# Patient Record
Sex: Male | Born: 1960 | Race: White | Hispanic: No | Marital: Married | Smoking: Never smoker
Health system: Southern US, Community
[De-identification: ages and names within clinical notes are randomized; demographics above are authoritative.]

## PROBLEM LIST (undated history)

## (undated) DIAGNOSIS — C801 Malignant (primary) neoplasm, unspecified: Secondary | ICD-10-CM

## (undated) DIAGNOSIS — E119 Type 2 diabetes mellitus without complications: Secondary | ICD-10-CM

## (undated) DIAGNOSIS — E7800 Pure hypercholesterolemia, unspecified: Secondary | ICD-10-CM

## (undated) DIAGNOSIS — K219 Gastro-esophageal reflux disease without esophagitis: Secondary | ICD-10-CM

## (undated) HISTORY — PX: TONSILLECTOMY: SUR1361

---

## 2003-11-17 ENCOUNTER — Inpatient Hospital Stay: Payer: Self-pay

## 2006-08-30 IMAGING — CT CT ABD-PELV W/ CM
1 of 3 series · 15 of 32 positions shown, 19 images · non-contrast
Comparison: none

REASON FOR EXAM: (1) abd pain  left lower quadrant  talked to dr. Heard;
(2) abd pain
COMMENTS:

PROCEDURE:     CT  - CT ABDOMEN / PELVIS  W  - November 17, 2003 [DATE]
RESULT:
TECHNIQUE: Axial 8 mm reconstructions were performed from the lung bases
through the pubic symphysis during administration of intravenous and after
administration of oral contrast.  There are no prior studies for comparison.

[Series 3: inspace · axial · 0.70mm/px · z∈[-859,-427]mm · 15 of 488 slices shown, 19 images]
[im 37/488  soft-tissue]
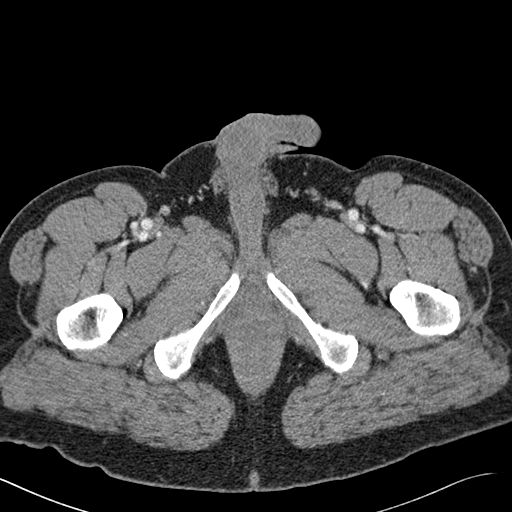
[im 37/488  bone]
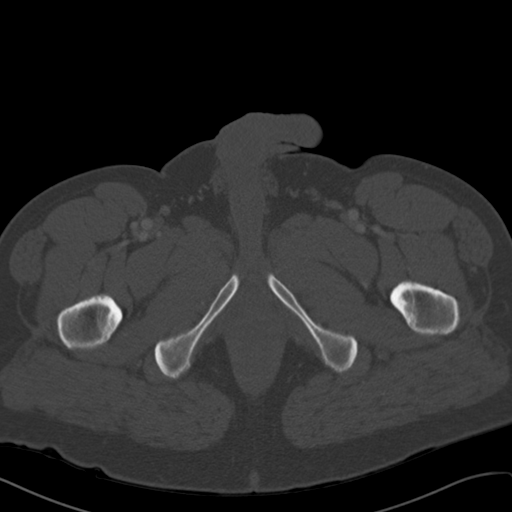
[im 73/488  soft-tissue]
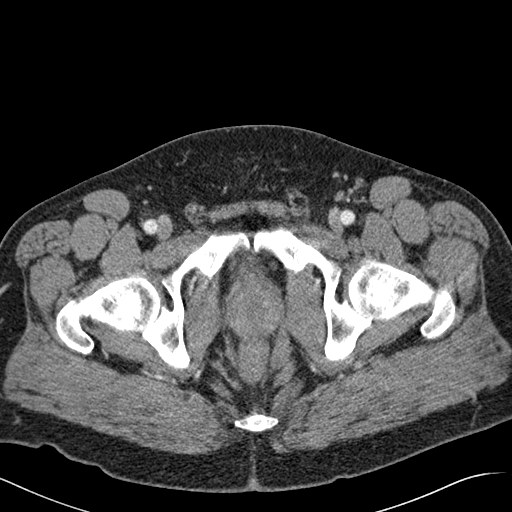
[im 109/488  soft-tissue]
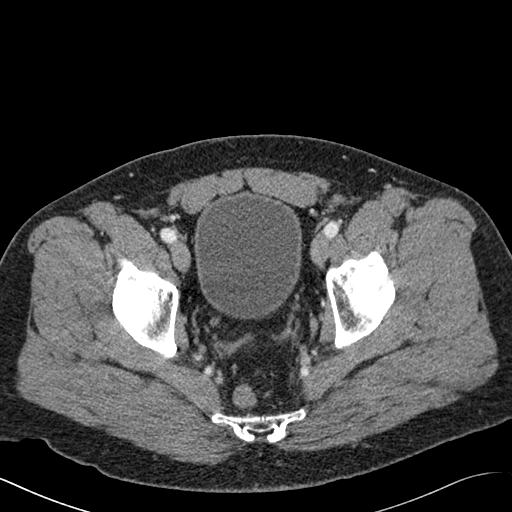
[im 145/488  soft-tissue]
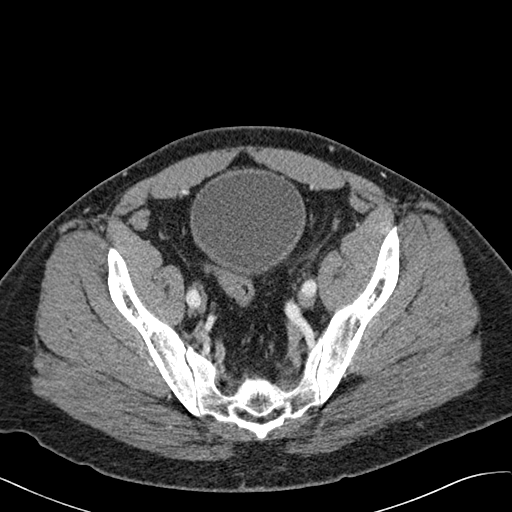
[im 181/488  soft-tissue]
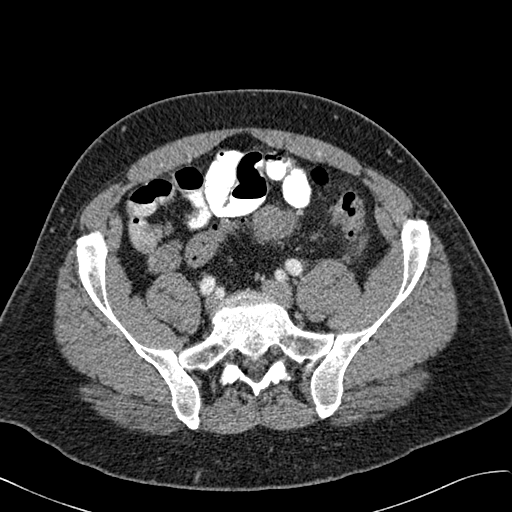
[im 217/488  soft-tissue]
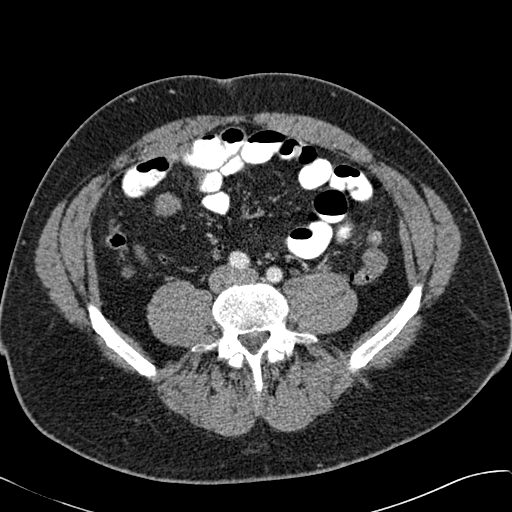
[im 253/488  soft-tissue]
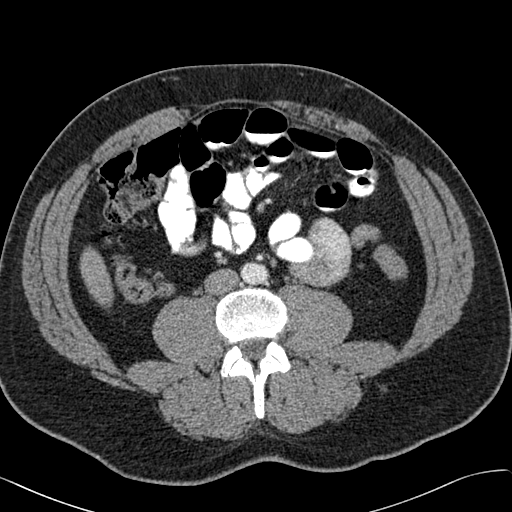
[im 289/488  soft-tissue]
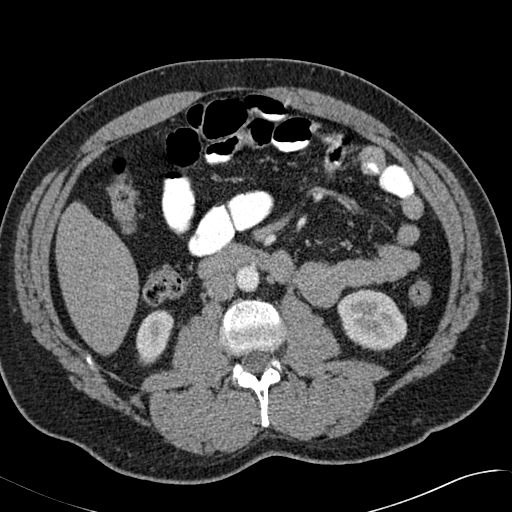
[im 325/488  soft-tissue]
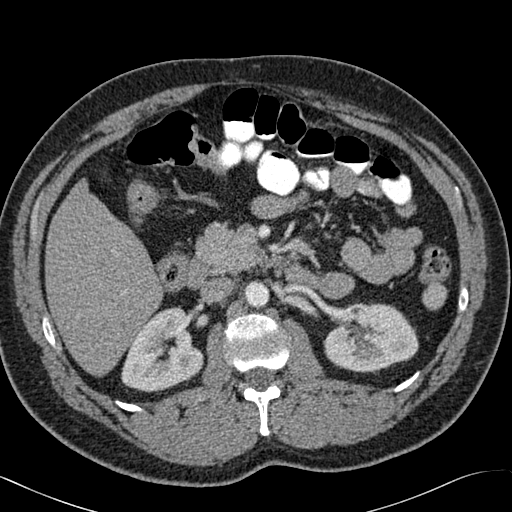
[im 325/488  bone]
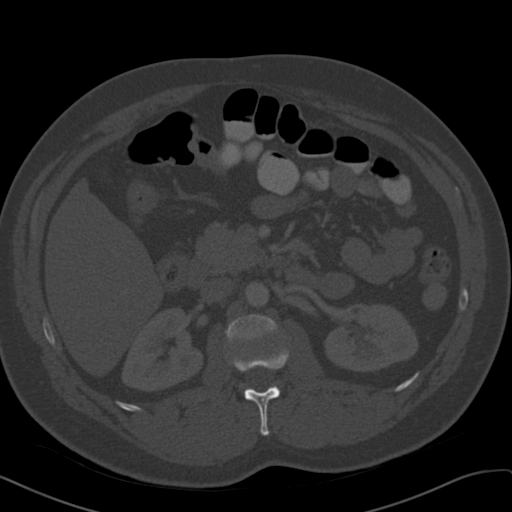
[im 361/488  soft-tissue]
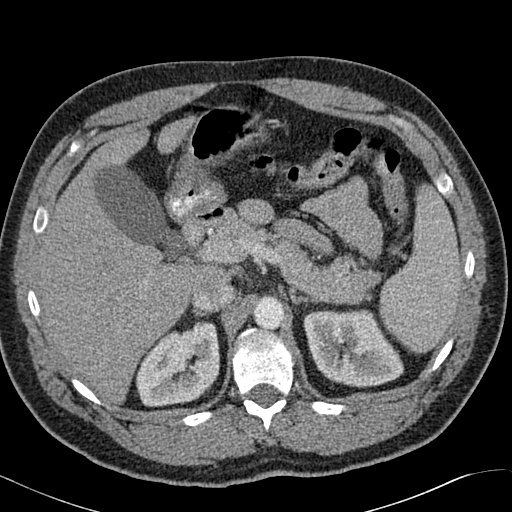
[im 397/488  soft-tissue]
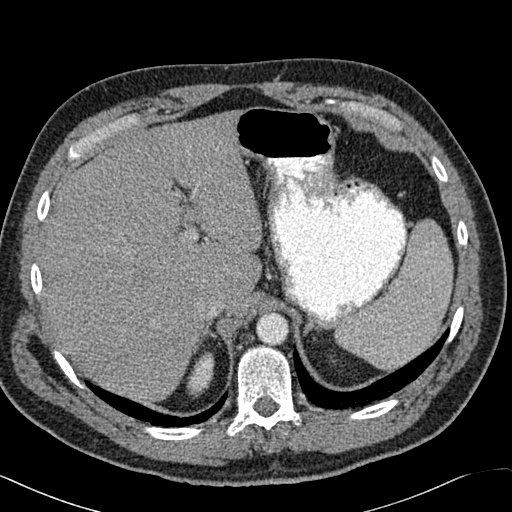
[im 415/488  lung]
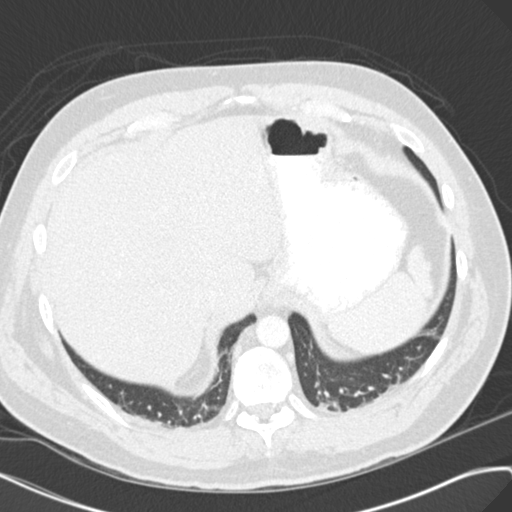
[im 433/488  soft-tissue]
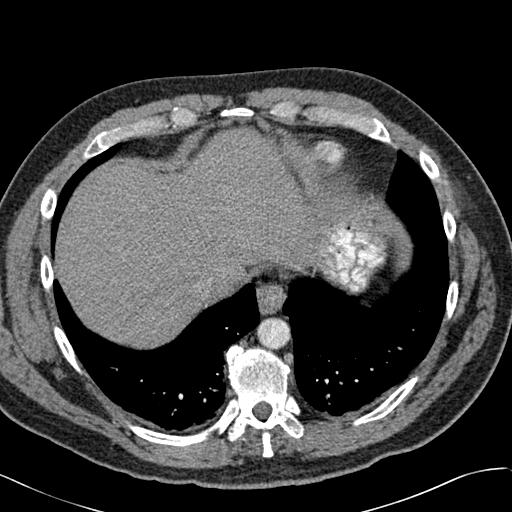
[im 433/488  lung]
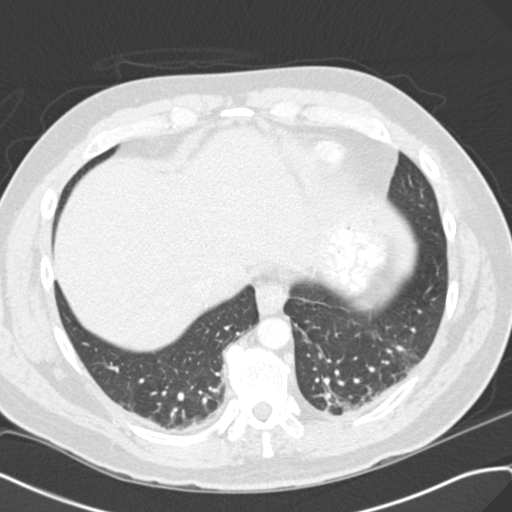
[im 451/488  lung]
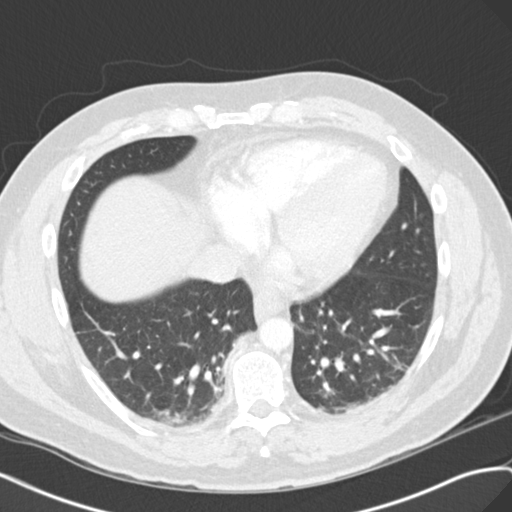
[im 469/488  soft-tissue]
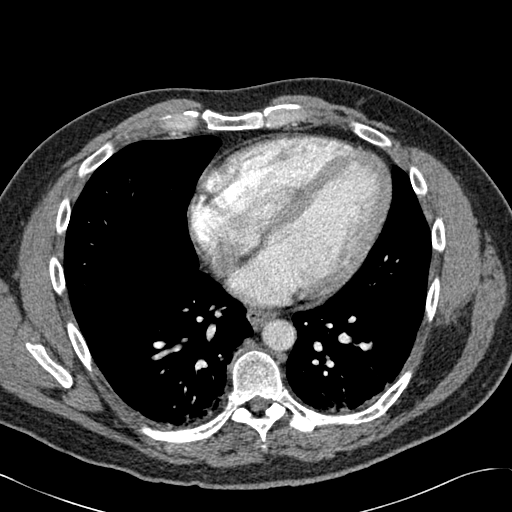
[im 469/488  lung]
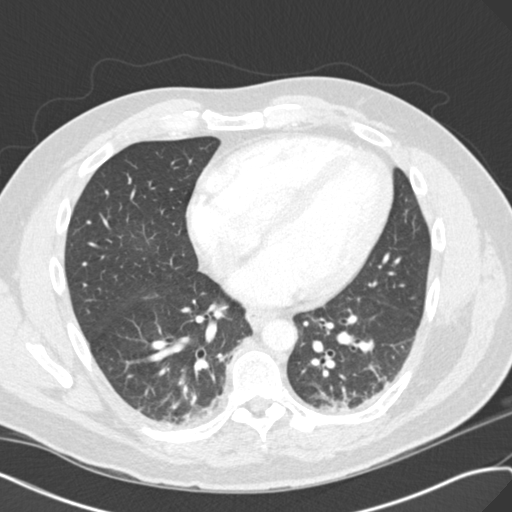

[15 of 32 positions shown; findings below may reference images not displayed]

There are minimal densities within both lung bases compatible with
atelectasis.  No pleural effusions are seen. The liver, spleen, pancreas,
adrenal glands and kidneys are unremarkable in appearance.  There are
multiple diverticula involving the sigmoid colon.  There is long segment
bowel wall thickening involving the sigmoid colon just distal to the
descending colon.  There is adjacent inflammatory stranding and the findings
are compatible with acute diverticulitis.  No fluid collections are
identified.  There is no free fluid.
IMPRESSION: 1)Multiple sigmoid diverticula with bowel wall thickening and adjacent
inflammatory stranding.  These findings are compatible with acute
diverticulitis.  No free fluid or focal fluid collections are seen.

The findings were discussed with Dr. Eung-Soo Hyun Jun.

## 2014-09-08 ENCOUNTER — Ambulatory Visit
Admission: EM | Admit: 2014-09-08 | Discharge: 2014-09-08 | Disposition: A | Discharge status: Home / Self Care | Payer: BC Managed Care – PPO | Attending: Internal Medicine | Admitting: Internal Medicine

## 2014-09-08 DIAGNOSIS — B370 Candidal stomatitis: Secondary | ICD-10-CM | POA: Diagnosis not present

## 2014-09-08 HISTORY — DX: Pure hypercholesterolemia, unspecified: E78.00

## 2014-09-08 HISTORY — DX: Malignant (primary) neoplasm, unspecified: C80.1

## 2014-09-08 HISTORY — DX: Gastro-esophageal reflux disease without esophagitis: K21.9

## 2014-09-08 HISTORY — DX: Type 2 diabetes mellitus without complications: E11.9

## 2014-09-08 MED ORDER — NYSTATIN 100000 UNIT/ML MT SUSP: 500000.0000 [IU] | Freq: Four times a day (QID) | OROMUCOSAL | Status: AC

## 2014-09-08 MED ORDER — NYSTATIN 100000 UNIT/GM EX CREA: TOPICAL_CREAM | Freq: Two times a day (BID) | CUTANEOUS | Status: AC

## 2014-09-08 NOTE — Discharge Instructions (Signed)
Prescription for nystatin suspension sent to the [facility] in [place].  Candida Infection A Candida infection (also called yeast, fungus, and Monilia infection) is an overgrowth of yeast that can occur anywhere on the body. A yeast infection commonly occurs in warm, moist body areas. Usually, the infection remains localized but can spread to become a systemic infection. A yeast infection may be a sign of a more severe disease such as diabetes, leukemia, or AIDS. A yeast infection can occur in both men and women. In women, Candida vaginitis is a vaginal infection. It is one of the most common causes of vaginitis. Men usually do not have symptoms or know they have an infection until other problems develop. Men may find out they have a yeast infection because their sex partner has a yeast infection. Uncircumcised men are more likely to get a yeast infection than circumcised men. This is because the uncircumcised glans is not exposed to air and does not remain as dry as that of a circumcised glans. Older adults may develop yeast infections around dentures. CAUSES  Women  Antibiotics.  Steroid medication taken for a long time.  Being overweight (obese).  Diabetes.  Poor immune condition.  Certain serious medical conditions.  Immune suppressive medications for organ transplant patients.  Chemotherapy.  Pregnancy.  Menstruation.  Stress and fatigue.  Intravenous drug use.  Oral contraceptives.  Wearing tight-fitting clothes in the crotch area.  Catching it from a sex partner who has a yeast infection.  Spermicide.  Intravenous, urinary, or other catheters. Men  Catching it from a sex partner who has a yeast infection.  Having oral or anal sex with a person who has the infection.  Spermicide.  Diabetes.  Antibiotics.  Poor immune system.  Medications that suppress the immune system.  Intravenous drug use.  Intravenous, urinary, or other catheters. SYMPTOMS   Women  Thick, white vaginal discharge.  Vaginal itching.  Redness and swelling in and around the vagina.  Irritation of the lips of the vagina and perineum.  Blisters on the vaginal lips and perineum.  Painful sexual intercourse.  Low blood sugar (hypoglycemia).  Painful urination.  Bladder infections.  Intestinal problems such as constipation, indigestion, bad breath, bloating, increase in gas, diarrhea, or loose stools. Men  Men may develop intestinal problems such as constipation, indigestion, bad breath, bloating, increase in gas, diarrhea, or loose stools.  Dry, cracked skin on the penis with itching or discomfort.  Jock itch.  Dry, flaky skin.  Athlete's foot.  Hypoglycemia. DIAGNOSIS  Women  A history and an exam are performed.  The discharge may be examined under a microscope.  A culture may be taken of the discharge. Men  A history and an exam are performed.  Any discharge from the penis or areas of cracked skin will be looked at under the microscope and cultured.  Stool samples may be cultured. TREATMENT  Women  Vaginal antifungal suppositories and creams.  Medicated creams to decrease irritation and itching on the outside of the vagina.  Warm compresses to the perineal area to decrease swelling and discomfort.  Oral antifungal medications.  Medicated vaginal suppositories or cream for repeated or recurrent infections.  Wash and dry the irritation areas before applying the cream.  Eating yogurt with Lactobacillus may help with prevention and treatment.  Sometimes painting the vagina with gentian violet solution may help if creams and suppositories do not work. Men  Antifungal creams and oral antifungal medications.  Sometimes treatment must continue for 30 days  after the symptoms go away to prevent recurrence. HOME CARE INSTRUCTIONS  Women  Use cotton underwear and avoid tight-fitting clothing.  Avoid colored, scented toilet  paper and deodorant tampons or pads.  Do not douche.  Keep your diabetes under control.  Finish all the prescribed medications.  Keep your skin clean and dry.  Consume milk or yogurt with Lactobacillus-active culture regularly. If you get frequent yeast infections and think that is what the infection is, there are over-the-counter medications that you can get. If the infection does not show healing in 3 days, talk to your caregiver.  Tell your sex partner you have a yeast infection. Your partner may need treatment also, especially if your infection does not clear up or recurs. Men  Keep your skin clean and dry.  Keep your diabetes under control.  Finish all prescribed medications.  Tell your sex partner that you have a yeast infection so he or she can be treated if necessary. SEEK MEDICAL CARE IF:   Your symptoms do not clear up or worsen in one week after treatment.  You have an oral temperature above 102 F (38.9 C).  You have trouble swallowing or eating for a prolonged time.  You develop blisters on and around your vagina.  You develop vaginal bleeding and it is not your menstrual period.  You develop abdominal pain.  You develop intestinal problems as mentioned above.  You get weak or light-headed.  You have painful or increased urination.  You have pain during sexual intercourse. MAKE SURE YOU:   Understand these instructions.  Will watch your condition.  Will get help right away if you are not doing well or get worse. Document Released: 03/03/2004 Document Revised: 06/10/2013 Document Reviewed: 06/15/2009 [facility] Patient Information 2015 [place], [place]. This information is not intended to replace advice given to you by your health care provider. Make sure you discuss any questions you have with your health care provider.

## 2014-09-08 NOTE — ED Provider Notes (Signed)
CSN: [id number]     Arrival date & time 09/08/14  1700 History   First MD Initiated Contact with Patient 09/08/14 1853     Chief Complaint  Patient presents with  . Mouth Lesions   Patient is a 54 y.o. male presenting with mouth sores.  Mouth Lesions  Patient is a 54 year old gentleman with past medical history notable for CML, taking Tasigna. He presents with a 4 day history of sore patch on the roof of his mouth, not too bad when he gets up in the morning, progressively worse during the day, particularly when eating and drinking. No new oral products, no new dental appliances. Does have history of borderline diabetes, and intermittent flareups of Candida balanitis. Further history is that his medication Tasigna interacts with "everything." He would prefer to avoid using Diflucan, as this has potential for prolonging the QTc interval. His sister is a cardiologist.  Past Medical History  Diagnosis Date  . GERD (gastroesophageal reflux disease)   . Hypercholesterolemia   . Diabetes mellitus without complication, managing with diet   . Cancer;CML, on Tasigna    Past Surgical History  Procedure Laterality Date  . Tonsillectomy     Family History  Problem Relation Age of Onset  . Osteoarthritis Mother   . Cancer Father    History  Substance Use Topics  . Smoking status: Never Smoker   . Smokeless tobacco: Not on file  . Alcohol Use: Yes     Comment: socially    Review of Systems  HENT: Positive for mouth sores.   All other systems reviewed and are negative.   Allergies  Review of patient's allergies indicates no known allergies.  Home Medications   Prior to Admission medications   Medication Sig Start Date End Date Taking? Authorizing Provider  nilotinib (TASIGNA) 200 MG capsule Take 400 mg by mouth every 12 (twelve) hours. Give on an empty stomach 1 hour before or 2 hours after meals.   Yes Historical Provider, MD  omeprazole (PRILOSEC) 40 MG capsule Take 40 mg by mouth  daily.   Yes Historical Provider, MD  ranitidine (ZANTAC) 150 MG tablet Take 150 mg by mouth 2 (two) times daily.   Yes Historical Provider, MD  rosuvastatin (CRESTOR) 20 MG tablet Take 20 mg by mouth daily.   Yes Historical Provider, MD                 BP 153/87 mmHg  Pulse 82  Temp(Src) 97.6 F (36.4 C) (Tympanic)  Resp 16  Ht 5\' 8"  (1.727 m)  Wt 210 lb (95.255 kg)  BMI 31.94 kg/m2  SpO2 99% Physical Exam  Constitutional: He is oriented to person, place, and time. No distress.  Alert, nicely groomed  HENT:  Head: Atraumatic.  Lateral TMs are moderately dull, left is red tinged Moderate nasal congestion Posterior palate has a 1" x 1.5" smooth erythematous patch centrally that is very sore; posterior pharynx is somewhat red.  Eyes:  Conjugate gaze, no eye redness/drainage  Neck: Neck supple.  Cardiovascular: Normal rate.   Pulmonary/Chest: No respiratory distress.  Abdominal: Soft. He exhibits no distension.  Musculoskeletal: Normal range of motion.  Neurological: He is alert and oriented to person, place, and time.  Skin: Skin is warm and dry.  No cyanosis  Nursing note and vitals reviewed.   ED Course  Procedures   MDM   1. Candida, oral    Review of the up-to-date website and the lexi-comp drug interaction checker suggest that  Tasigna does not interact with nystatin suspension. Prescription for nystatin suspension is sent to the pharmacy.    Sherlene Shams, MD 09/08/14 2012

## 2014-09-08 NOTE — ED Notes (Signed)
States started Thursday with sore palate of mouth. ? Small white lesion
